# Patient Record
Sex: Female | Born: 1980 | Race: Black or African American | Hispanic: No | Marital: Single | Smoking: Current every day smoker
Health system: Southern US, Community
[De-identification: ages and names within clinical notes are randomized; demographics above are authoritative.]

## PROBLEM LIST (undated history)

## (undated) DIAGNOSIS — J45909 Unspecified asthma, uncomplicated: Secondary | ICD-10-CM

## (undated) HISTORY — DX: Unspecified asthma, uncomplicated: J45.909

---

## 2012-02-26 ENCOUNTER — Ambulatory Visit (INDEPENDENT_AMBULATORY_CARE_PROVIDER_SITE_OTHER): Payer: BC Managed Care – PPO | Admitting: Family Medicine

## 2012-02-26 VITALS — BP 127/86 | HR 90 | Temp 98.7°F | Resp 16 | Ht 64.0 in | Wt 309.0 lb

## 2012-02-26 DIAGNOSIS — M542 Cervicalgia: Secondary | ICD-10-CM

## 2012-02-26 MED ORDER — METHYLPREDNISOLONE 4 MG PO TABS: ORAL_TABLET | ORAL | Status: DC

## 2012-02-26 MED ORDER — CYCLOBENZAPRINE HCL 10 MG PO TABS: ORAL_TABLET | ORAL | Status: DC

## 2012-02-26 NOTE — Progress Notes (Signed)
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 The pain is sharp whenever she rotates her neck left or right. Has no symptoms radiating down her arms, no numbness no, no weakness in arms. She's never had this before. She's had no neck trauma or head trauma.  Objective: Obese woman in no acute distress. She moves her neck slowly and incompletely left and right. Nevertheless her neck is supple.  Has no adenopathy in her neck, neuro exam is negative from both upper extremities with negative reflex changes, sensory changes, motor changes.  She is tender at the base of C7 on the left.  Chest clear  Assessment: Acute court-ordered Collis  Plan Flexeril at night Medrol for 3 days

## 2012-02-26 NOTE — Patient Instructions (Signed)
Torticollis, Acute You have suddenly (acutely) developed a twisted neck (torticollis). This is usually a self-limited condition. CAUSES  Acute torticollis may be caused by malposition, trauma or infection. Most commonly, acute torticollis is caused by sleeping in an awkward position. Torticollis may also be caused by the flexion, extension or twisting of the neck muscles beyond their normal position. Sometimes, the exact cause may not be known. SYMPTOMS  Usually, there is pain and limited movement of the neck. Your neck may twist to one side. DIAGNOSIS  The diagnosis is often made by physical examination. X-rays, CT scans or MRIs may be done if there is a history of trauma or concern of infection. TREATMENT  For a common, stiff neck that develops during sleep, treatment is focused on relaxing the contracted neck muscle. Medications (including shots) may be used to treat the problem. Most cases resolve in several days. Torticollis usually responds to conservative physical therapy. If left untreated, the shortened and spastic neck muscle can cause deformities in the face and neck. Rarely, surgery is required. HOME CARE INSTRUCTIONS   Use over-the-counter and prescription medications as directed by your caregiver.   Do stretching exercises and massage the neck as directed by your caregiver.   Follow up with physical therapy if needed and as directed by your caregiver.  SEEK IMMEDIATE MEDICAL CARE IF:   You develop difficulty breathing or noisy breathing (stridor).   You drool, develop trouble swallowing or have pain with swallowing.   You develop numbness or weakness in the hands or feet.   You have changes in speech or vision.   You have problems with urination or bowel movements.   You have difficulty walking.   You have a fever.   You have increased pain.  MAKE SURE YOU:   Understand these instructions.   Will watch your condition.   Will get help right away if you are not  doing well or get worse.  Document Released: 11/15/2000 Document Revised: 11/07/2011 Document Reviewed: 12/27/2009 [facility] Patient Information 2012 [place], [place].Place neck pain patient instructions here.

## 2013-06-06 ENCOUNTER — Ambulatory Visit (INDEPENDENT_AMBULATORY_CARE_PROVIDER_SITE_OTHER): Payer: Managed Care, Other (non HMO) | Admitting: Family Medicine

## 2013-06-06 ENCOUNTER — Ambulatory Visit: Payer: Managed Care, Other (non HMO)

## 2013-06-06 VITALS — BP 122/74 | HR 101 | Temp 98.1°F | Resp 18 | Ht 64.5 in | Wt 302.0 lb

## 2013-06-06 DIAGNOSIS — M25569 Pain in unspecified knee: Secondary | ICD-10-CM

## 2013-06-06 DIAGNOSIS — G8929 Other chronic pain: Secondary | ICD-10-CM

## 2013-06-06 DIAGNOSIS — M238X9 Other internal derangements of unspecified knee: Secondary | ICD-10-CM

## 2013-06-06 DIAGNOSIS — M25362 Other instability, left knee: Secondary | ICD-10-CM

## 2013-06-06 DIAGNOSIS — M542 Cervicalgia: Secondary | ICD-10-CM

## 2013-06-06 MED ORDER — CYCLOBENZAPRINE HCL 10 MG PO TABS: ORAL_TABLET | ORAL | Status: DC

## 2013-06-06 MED ORDER — NAPROXEN 500 MG PO TABS: 500.0000 mg | ORAL_TABLET | Freq: Two times a day (BID) | ORAL | Status: AC

## 2013-06-06 NOTE — Patient Instructions (Signed)
Patella Problems (Patellofemoral Syndrome) This syndrome is caused by changes in the undersurface of the kneecap (patella). The changes vary from minor inflammation to major changes such as breakdown of the cartilage on the undersurface of the patella. The major changes can be seen with an arthroscope (a small, pencil-sized telescope). These changes can result from various factors. These factors may arise from abnormal tracking (movement or malalignment) of the patella. Normally the Patella is in its normal groove located between the condyles (grooved end) of the femur (thigh bone). Abnormal movement leads to increased pressure in the patellofemoral joint. This leads to swelling in the cartilage, inflammation and pain. SYMPTOMS  The patient with this syndrome usually has an ache in the knee. It is often aggravated by:  Prolonged sitting.  Squatting.  Climbing stairs.  Running down hill.  Other exercising that stresses the knee. Other findings may include the knee giving way, swelling, and or locking. TREATMENT  The treatment will depend on the cause of the problem. Sometimes the solution is as simple as cutting down on activities. Giving your joint a rest with the use of crutches and braces can also help. This is generally followed by strengthening exercises. RECOVERY Recovery from a patellar problem depends on the type of problem in your knee and on the treatment required. If conservative treatment works the recovery period may be as little as three to four weeks. If more aggressive therapy such as surgery is required, the recovery period may be several months. Your caregiver will discuss this with you. HOME CARE INSTRUCTIONS  Following exercise, use an ice pack for twenty to thirty minutes three to four times per day. Use a towel between your ice pack and the skin.  Reduction of inflammation with anti-inflammatories may be helpful. Only take over-the-counter or prescription medicines for  pain, discomfort, or fever as directed by your caregiver.  Taping the knee or using a neoprene sleeve with a patellar cutout to provide better tracking of the patella may give relief.  Muscle (quadriceps) strengthening exercises are helpful. Follow your caregiver's advice.  Muscle stretching prior to exercise may be helpful.  Soft tissue therapy using ultrasound, and diathermy may be helpful.  If conservative therapy is not effective, surgery may provide relief. During arthroscopy, your caregiver may discover a rough surface beneath your kneecap. If this happens, your caregiver may smooth this out by shaving the surface. SEEK MEDICAL CARE IF: If you have surgery, see your caregiver if:  There is increased bleeding or clear fluid (more than a small spot) from the wound.  You notice redness, swelling, or increasing pain in the wound.  Pus is coming from wound.  You develop an unexplained oral temperature above 102 F (38.9 C) develops, or as your caregiver suggests.  You notice a foul smell coming from the wound or dressing.  You develop increasing pain or stiffness in your knee. SEEK IMMEDIATE MEDICAL CARE IF:   You develop a rash.  You have difficulty breathing.  You have any allergic problems. MAKE SURE YOU:   Understand these instructions.  Will watch your condition.  Will get help right away if you are not doing well or get worse. Document Released: 11/15/2000 Document Revised: 02/10/2012 Document Reviewed: 12/05/2008 [facility] Patient Information 2014 [place], [place]. Patellar Dislocation and Subluxation with Phase I Rehab Injuries to the knee often include kneecap (patellar) dislocation or subluxation. The patella is a V-shaped bone that sits in a groove in the thigh bone (trochlea). A patellar dislocation occurs  when the kneecap is displaced from the trochlea, and the joint surfaces are no longer touching. A subluxation is a similar injury, where the kneecap becomes  displaced, but the joint surfaces are still touching. Patellar dislocations and subluxations are most common in adolescents and younger adults. SYMPTOMS   Severe pain in the front of the knee when attempting to move the knee.  A feeling of the knee giving way.  Tenderness, swelling, and bruising (contusion) of the knee.  Numbness or paralysis below the dislocation, from pinching, cutting, or pressure on the blood vessels or nerves (uncommon).  Visible deformity, especially if the dislocation of the kneecap occurs towards the outside of the knee.  Lump on the inner knee, which is the end of the inner part of the thigh bone (femur). CAUSES  Patellar dislocations are caused by a force placed on the kneecap, that is strong enough to displace the bone from its proper alignment. Common causes of injury include:  Direct hit (trauma) to the knee.  Twisting or pivoting injury to the lower limb, when the foot is planted on the ground.  Powerful muscle contraction.  Birth defect (congenital abnormality), such as shallow or malformed joint surfaces. RISK INCREASES WITH:  Contact sports (football, rugby, soccer), sports that require jumping and landing (basketball, volleyball), or sports in which cleats are worn on shoes.  People with wide pelvis, knocked knees, shallow or malformed joint surfaces.  Previous knee injury.  Poor strength and flexibility. PREVENTION  Warm up and stretch properly before activity.  Maintain physical fitness:  Strength, flexibility, and endurance.  Cardiovascular fitness.  For jumping or contact sports, protect the kneecap with supportive devices (elastic bandages, tape, braces, knee sleeves with a hole for the patella and a built-up outer side, or straps to pull the patella inward, or knee pads).  Use cleats of proper length. PROGNOSIS  If treated properly, patellar dislocations and subluxations usually require at least 6 weeks to heal.  RELATED  COMPLICATIONS   Associated fracture or joint cartilage injury.  Damage to nearby nerves or major blood vessels (rare).  Longer healing time or recurring dislocation, if activity is resumed too soon.  Excessive bleeding within the knee, due to dislocation.  Kneecap pain and giving way, often due to inadequate or incomplete rehabilitation.  Unstable or arthritic joint, following repeated injury or delayed treatment. TREATMENT Patellar dislocations and subluxations require immediate realigning of the bones (reduction). Realigning is often completed by hand. However, surgery is sometimes needed. After realignment, treatment first involves the use of ice and medicine, to reduce pain and inflammation. Elevating the injured knee above the level of the heart will also help reduce inflammation. Restraining the knee is often needed, to allow for healing, for up to 6 weeks. After restraint, it is important to perform strengthening and stretching exercises to help regain strength and a full range of motion. These exercises may be completed at home or with a therapist. MEDICATION   If pain medicine is needed, nonsteroidal anti-inflammatory medicines (aspirin and ibuprofen), or other minor pain relievers (acetaminophen), are often advised.  Do not take pain medicine for 7 days before surgery.  Prescription pain relievers may be given, if your caregiver thinks they are needed. Use only as directed and only as much as you need. HEAT AND COLD  Cold treatment (icing) should be applied for 10 to 15 minutes every 2 to 3 hours for inflammation and pain, and immediately after activity that aggravates your symptoms. Use ice packs or  an ice massage.  Heat treatment may be used before performing stretching and strengthening activities prescribed by your caregiver, physical therapist, or athletic trainer. Use a heat pack or a warm water soak. SEEK MEDICAL CARE IF:  Pain, tenderness, or swelling gets worse,  despite treatment.  You experience pain, numbness, or coldness in the foot.  Blue, gray, or dark color appears in the toenails.  Any of the following signs of infection occur after surgery: fever, increased pain, swelling, redness, drainage of fluids, or bleeding in the affected area.  New, unexplained symptoms develop. (Drugs used in treatment may produce side effects.) EXERCISES PHASE I EXERCISES RANGE OF MOTION (ROM) AND STRETCHING EXERCISES - Patellar Dislocation and Subluxation Phase I  FIRST TIME DISLOCATIONS OR SUBLUXATIONS: If you have dislocated or subluxed your kneecap for the first time, your caregiver may ask you to wear a knee brace for up to 6 weeks after your injury. This brace will keep your knee completely straight so that the healing tissues are not disrupted by your knee movement. Your caregiver may allow some motion at your knee by using a hinged brace or by allowing you to take your brace off for a limited amount of time to gently bend and straighten your knee. If this is the case, closely follow your caregiver's directions, in order to allow the best recovery.   CHRONIC OR REPEATED DISLOCATIONS OR SUBLUXATIONS: If you have chronic or repeated dislocations or subluxations, your caregiver will likely not ask you to use a brace. He or she will likely have you begin gentle range of motion activities, within the range that is comfortable for you.  Once you are allowed to start moving your knee, these are some of the initial exercises that may be included in your rehabilitation program. Continue to perform them until you see your caregiver again or until your symptoms go away. While completing these exercises, remember:   Restoring tissue flexibility helps normal motion to return to the joints. This allows healthier, less painful movement and activity.  An effective stretch should be held for at least 30 seconds.  A stretch should never be painful. You should only feel a  gentle lengthening or release in the stretched tissue. RANGE OF MOTION - Knee Flexion, Active  Lie on your back with both knees straight. (If this causes back discomfort, bend your opposite knee, placing your foot flat on the floor.)  Slowly slide your heel back toward your buttocks until you feel a gentle stretch in the front of your knee or thigh.  Hold for __________ seconds. Slowly slide your heel back to the starting position. Repeat __________ times. Complete this exercise __________ times per day.  RANGE OF MOTION - Knee Flexion and Extension, Active-Assisted  Sit on the edge of a table or chair with your thighs firmly supported. It may be helpful to place a folded towel under the end of your right / left thigh.  Flexion (bending): Place the ankle of your healthy leg on top of the other ankle. Use your healthy leg to gently bend your right / left knee until you feel a mild tension across the top of your knee.  Hold for __________ seconds.  Extension (straightening): Switch your ankles so your right / left leg is on top. Use your healthy leg to straighten your right / left knee until you feel a mild tension on the backside of your knee.  Hold for __________ seconds. Repeat __________ times. Complete this exercise __________ times per  day. STRETCH - Knee Flexion, Supine  Lie on the floor with your right / left heel and foot lightly touching the wall. (Place both feet on the wall if you do not use a door frame.)  Without using any effort, allow gravity to slide your foot down the wall slowly until you feel a gentle stretch in the front of your right / left knee.  Hold this stretch for __________ seconds. Then return the leg to the starting position, using your healthy leg for help, if needed. Repeat __________ times. Complete this stretch __________ times per day.  STRENGTHENING EXERCISES - Patellar Dislocation and Subluxation Phase I These exercises may help you when beginning to  rehabilitate your injury. They may resolve your symptoms with or without further involvement from your physician, physical therapist or athletic trainer. While completing these exercises, remember:   Muscles can gain both the endurance and the strength needed for everyday activities through controlled exercises.  Complete these exercises as instructed by your physician, physical therapist or athletic trainer. Increase the resistance and repetitions only as guided by your caregiver. STRENGTH - Quadriceps, Isometrics  Lie on your back with your right / left leg extended and your opposite knee bent.  Gradually tense the muscles in the front of your right / left thigh. You should see either your kneecap slide up toward your hip or increased dimpling just above the knee. This motion will push the back of the knee down toward the floor, mat, or bed on which you are lying.  Hold the muscle as tight as you can, without increasing your pain, for __________ seconds.  Relax the muscles slowly and completely in between each repetition. Repeat __________ times. Complete this exercise __________ times per day.  STRENGTH - Quadriceps, Straight Leg Raises Quality counts! Watch for signs that the quadriceps muscle is working, to be sure you are strengthening the correct muscles and not "cheating" by substituting with healthier muscles.  Lay on your back with your right / left leg extended and your opposite knee bent.  Tense the muscles in the front of your right / left thigh. You should see either your kneecap slide up or increased dimpling just above the knee. Your thigh may even shake a bit.  Tighten these muscles even more and raise your leg 4 to 6 inches off the floor. Hold for __________ seconds.  Keeping these muscles tense, lower your leg.  Relax the muscles slowly and completely between each repetition. Repeat __________ times. Complete this exercise __________ times per day.  STRENGTH - Hip  Abductors, Straight Leg Raises Be aware of your form throughout the entire exercise, so that you exercise the correct muscles. Poor form means that you are not strengthening the correct muscles.  Lie on your side so that your head, shoulders, knee and hip line up. You may bend your lower knee to help maintain your balance. Your right / left leg should be on top.  Roll your hips slightly forward, so that your hips are stacked directly over each other and your right / left knee is facing forward.  Lift your top leg up 4-6 inches, leading with your heel. Be sure that your foot does not drift forward or that your knee does not roll toward the ceiling.  Hold this position for __________ seconds. You should feel the muscles in your outer hip lifting. (You may not notice this until your leg begins to tire.)  Slowly lower your leg to the starting position. Allow  the muscles to fully relax before beginning the next repetition. Repeat __________ times. Complete this exercise __________ times per day.  STRENGTH - Hip Extensors, Straight Leg Raises  Lie on your stomach on a firm surface.  Tense the muscles in your buttocks to lift your right / left leg about 4 inches. If you cannot lift your leg this high without arching your back, place a pillow under your hips.  Keep your knee straight. Hold __________ seconds.  Slowly lower your leg to the starting position and allow it to relax completely before completing the next repetition. Repeat __________ times. Complete this exercise __________ times per day.  STRENGTH - Hip Adductors, Straight Leg Raises  Lie on your side so that your head, shoulders, knee and hip line up. You may place your upper foot in front, to help maintain your balance. Your right / left leg should be on the bottom.  Roll your hips slightly forward, so that your hips are stacked directly over each other and your right / left knee is facing forward.  Tense the muscles in your inner  thigh and lift your bottom leg 4-6 inches. Hold this position for __________ seconds.  Slowly lower your leg to the starting position. Allow the muscles to fully relax before beginning the next repetition. Repeat __________ times. Complete this exercise __________ times per day.  Document Released: 11/18/2005 Document Revised: 02/10/2012 Document Reviewed: 03/02/2009 [facility] Patient Information 2014 [place], [place].

## 2013-06-06 NOTE — Progress Notes (Signed)
Subjective:    Patient ID: Katie Torres, female    DOB: 08-Jan-1981, 32 y.o.   MRN: [id number] Chief Complaint  Patient presents with  . Knee Pain    pops in and out of place since age 51 was playing volley friday when it happen again    HPI  She frequently feels her left knee cap click and it is very painful - just lasts a second - has never seen it actually pop out of place but she assumes this is what is happening. When this first started happening over 15 yrs, she initially had an orthopedist eval with xrays - told her nothing was wrong so has just been putting up with it since.  However, 2d ago when playing volleyball it happened twice and since has had continuing pain in the lower medial aspect of her knee which is unusual for her.  Has tried some biofreeze cream and hot baths with some improvement but still hurts to walk and can't flex her knee all the way.  Lots of women in her family have had to have knee surgeries.  Past Medical History  Diagnosis Date  . Asthma    Current Outpatient Prescriptions on File Prior to Visit  Medication Sig Dispense Refill  . cyclobenzaprine (FLEXERIL) 10 MG tablet Take one each night before bedtime  7 tablet  0   No current facility-administered medications on file prior to visit.   No Known Allergies  Family History  Problem Relation Age of Onset  . Hypertension Mother   . Diabetes Father    History reviewed. No pertinent past surgical history.   Review of Systems  Constitutional: Positive for activity change. Negative for fever, chills and unexpected weight change.  Musculoskeletal: Positive for joint swelling, arthralgias and gait problem. Negative for myalgias and back pain.  Skin: Negative for color change and rash.  Neurological: Negative for weakness and numbness.       BP 122/74  Pulse 101  Temp(Src) 98.1 F (36.7 C) (Oral)  Resp 18  Ht 5' 4.5" (1.638 m)  Wt 302 lb (136.986 kg)  BMI 51.06 kg/m2  SpO2 98%  LMP  05/21/2013 Objective:   Physical Exam  Constitutional: She is oriented to person, place, and time. She appears well-developed and well-nourished. No distress.  HENT:  Head: Normocephalic and atraumatic.  Right Ear: External ear normal.  Eyes: Conjunctivae are normal. No scleral icterus.  Pulmonary/Chest: Effort normal.  Musculoskeletal:       Right knee: Normal.       Left knee: She exhibits decreased range of motion, abnormal patellar mobility, abnormal meniscus and MCL laxity. She exhibits no swelling, no effusion, no ecchymosis, normal alignment, no LCL laxity and no bony tenderness. Tenderness found. Medial joint line and MCL tenderness noted. No lateral joint line, no LCL and no patellar tendon tenderness noted.  Normal extension and passive flexion but active flexion mildly reduced.  Neurological: She is alert and oriented to person, place, and time.  Skin: Skin is warm and dry. She is not diaphoretic. No erythema.  Psychiatric: She has a normal mood and affect. Her behavior is normal.     [facility] reading (PRIMARY) by  Dr. Clelia Croft. No acute abnormality  Assessment & Plan:  Patellar instability, left - Plan: Ambulatory referral to Orthopedic Surgery - rec ice tid, naprosyn, and neoprene sleave with patellar cut out. When back to baseline, start quad strengthening exercises.  Will refer to ortho since diagnosis unclear but suspect pt will actually  need PT.  Knee pain, chronic, left - Plan: DG Knee 1-2 Views Left, Ambulatory referral to Orthopedic Surgery   Meds ordered this encounter  Medications  . cyclobenzaprine (FLEXERIL) 10 MG tablet    Sig: Take one each night before bedtime    Dispense:  7 tablet    Refill:  0  . naproxen (NAPROSYN) 500 MG tablet    Sig: Take 1 tablet (500 mg total) by mouth 2 (two) times daily with a meal.    Dispense:  60 tablet    Refill:  0

## 2013-12-28 IMAGING — CR DG KNEE 1-2V*L*
2 series · 2 of 2 positions shown · non-contrast
Comparison: None

CLINICAL DATA: Knee pain

LEFT KNEE - 1-2 VIEW

[AP]
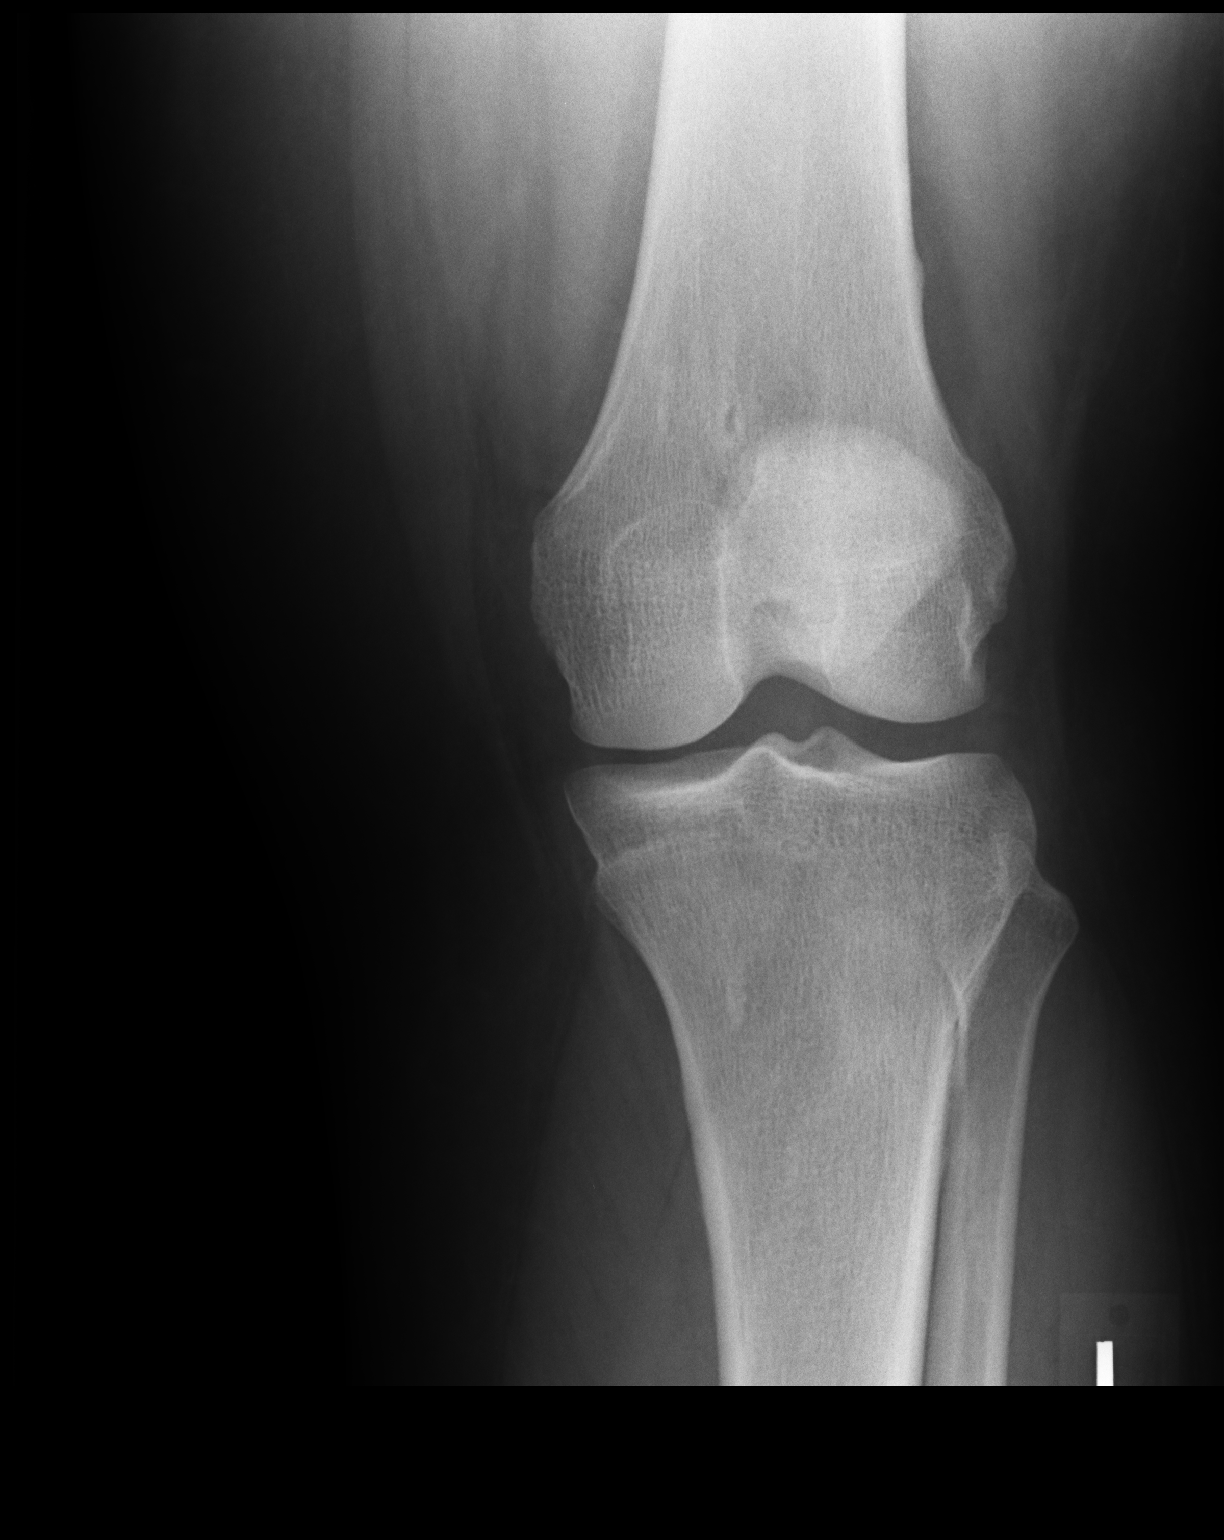

[lateral]
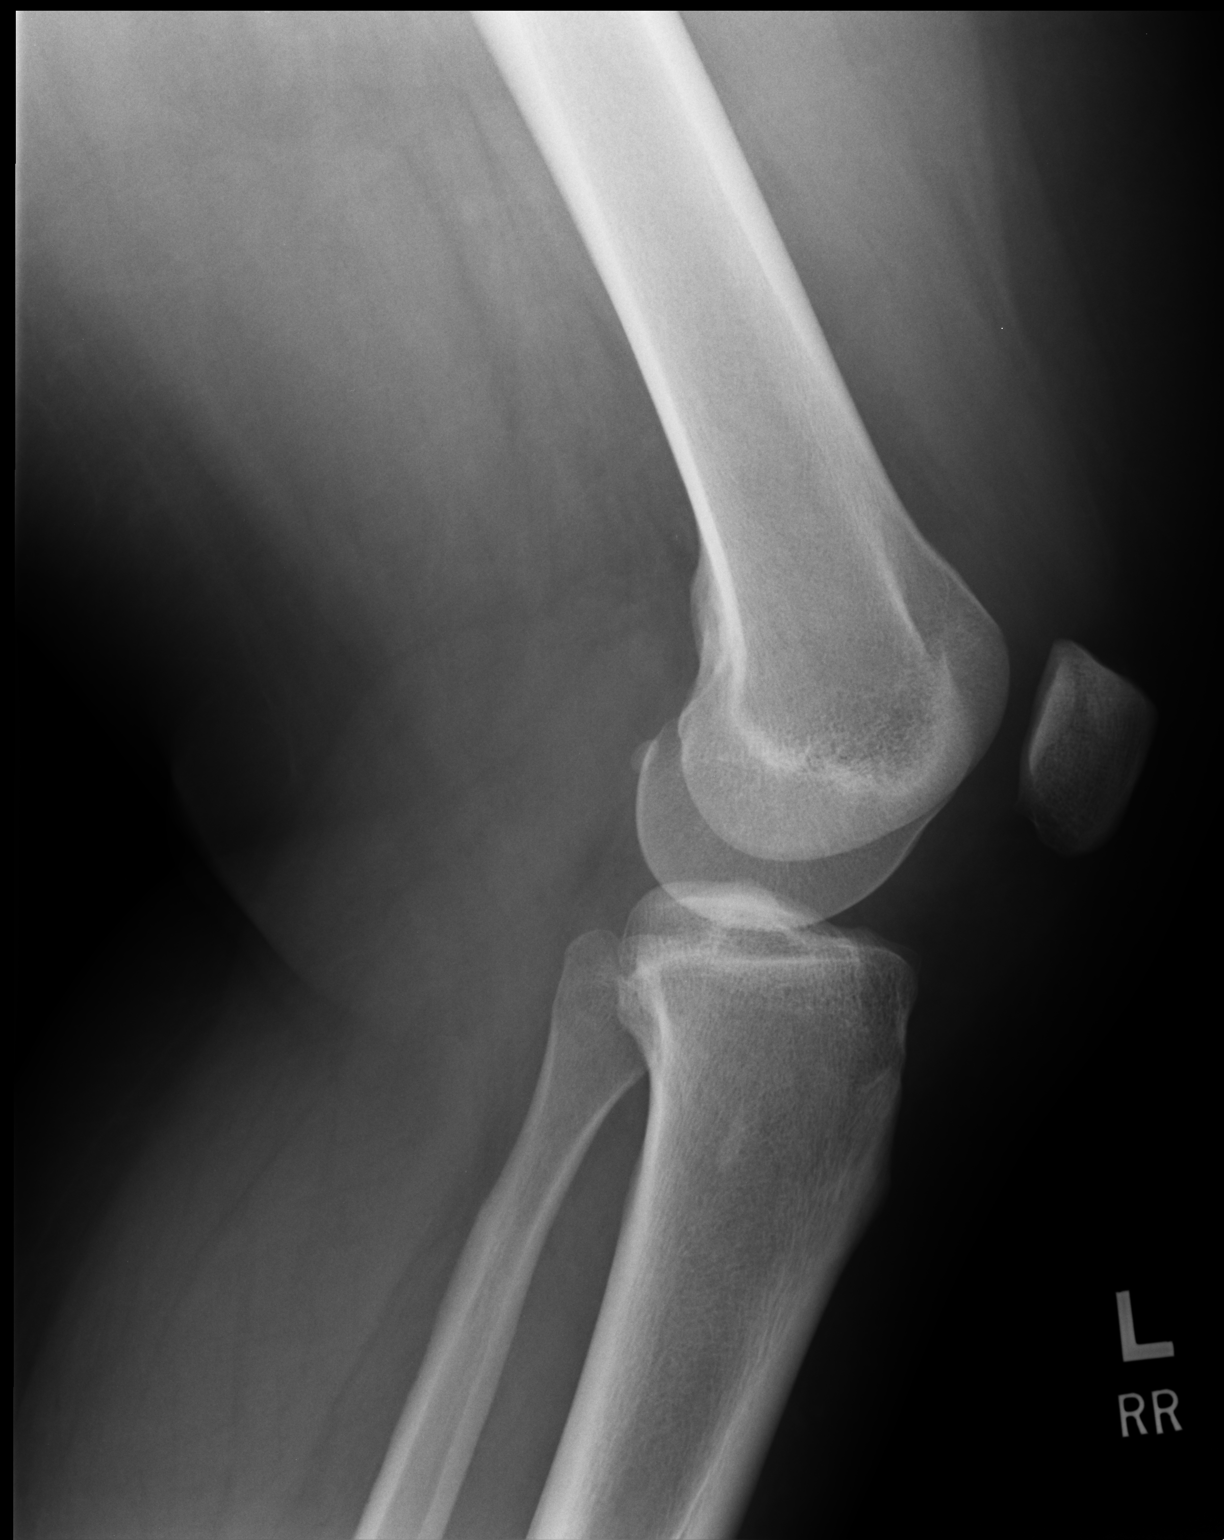

[2 of 2 positions shown; findings below may reference images not displayed]

FINDINGS: There is no evidence of fracture or dislocation.  There
is no evidence of arthropathy or other focal bone abnormality.
Soft tissues are unremarkable.
IMPRESSION: Negative examination.

Clinically significant discrepancy from primary report, if
provided: None

## 2015-01-10 ENCOUNTER — Ambulatory Visit (INDEPENDENT_AMBULATORY_CARE_PROVIDER_SITE_OTHER): Payer: Managed Care, Other (non HMO) | Admitting: Emergency Medicine

## 2015-01-10 VITALS — BP 113/77 | HR 110 | Temp 97.8°F | Resp 18 | Ht 64.0 in | Wt 304.0 lb

## 2015-01-10 DIAGNOSIS — S46911A Strain of unspecified muscle, fascia and tendon at shoulder and upper arm level, right arm, initial encounter: Secondary | ICD-10-CM

## 2015-01-10 MED ORDER — ACETAMINOPHEN-CODEINE #3 300-30 MG PO TABS: 1.0000 | ORAL_TABLET | ORAL | Status: AC | PRN

## 2015-01-10 MED ORDER — CYCLOBENZAPRINE HCL 10 MG PO TABS: 10.0000 mg | ORAL_TABLET | Freq: Three times a day (TID) | ORAL | Status: DC | PRN

## 2015-01-10 MED ORDER — NAPROXEN SODIUM 550 MG PO TABS: 550.0000 mg | ORAL_TABLET | Freq: Two times a day (BID) | ORAL | Status: AC

## 2015-01-10 NOTE — Progress Notes (Signed)
[facility] [address], [place] [address] [phone]  Date:  01/10/2015   Name:  Katie Torres   DOB:  1981-03-28   MRN:  [id number]  PCP:  No primary care provider on file.    Chief Complaint: Shoulder Pain and Chest Pain   History of Present Illness:  Katie Torres is a 34 y.o. very pleasant female patient who presents with the following:  Injured her right shoulder Saturday night bowling.  Now has pain in shoulder into neck.  No paresthesias or weakness in arm No direct injury Works on computers in billing. No improvement with over the counter medications or other home remedies.  Denies other complaint or health concern today.   There are no active problems to display for this patient.   Past Medical History  Diagnosis Date  . Asthma     History reviewed. No pertinent past surgical history.  History  Substance Use Topics  . Smoking status: Current Every Day Smoker  . Smokeless tobacco: Not on file  . Alcohol Use: No    Family History  Problem Relation Age of Onset  . Hypertension Mother   . Diabetes Father     No Known Allergies  Medication list has been reviewed and updated.  Current Outpatient Prescriptions on File Prior to Visit  Medication Sig Dispense Refill  . cyclobenzaprine (FLEXERIL) 10 MG tablet Take one each night before bedtime (Patient not taking: Reported on 01/10/2015) 7 tablet 0  . naproxen (NAPROSYN) 500 MG tablet Take 1 tablet (500 mg total) by mouth 2 (two) times daily with a meal. (Patient not taking: Reported on 01/10/2015) 60 tablet 0   No current facility-administered medications on file prior to visit.    Review of Systems:  As per HPI, otherwise negative.    Physical Examination: Filed Vitals:   01/10/15 1326  BP: 113/77  Pulse: 110  Temp: 97.8 F (36.6 C)  Resp: 18   Filed Vitals:   01/10/15 1326  Height: 5\' 4"  (1.626 m)  Weight: 304 lb (137.893 kg)   Body mass index is 52.16  kg/(m^2). Ideal Body Weight: Weight in (lb) to have BMI = 25: 145.3   GEN: WDWN, NAD, Non-toxic, Alert & Oriented x 3 HEENT: Atraumatic, Normocephalic.  Ears and Nose: No external deformity. EXTR: No clubbing/cyanosis/edema NEURO: Normal gait.  PSYCH: Normally interactive. Conversant. Not depressed or anxious appearing.  Calm demeanor.  Right shoulder:  Tender anterior shoulder-pectoral region.  Full ROM shoulder.  Assessment and Plan: Shoulder strain Anaprox Flexeril tyl #3 RICE  Signed,  Phillips OdorJeffery Mady Oubre, MD

## 2015-01-10 NOTE — Patient Instructions (Signed)
Shoulder Sprain °A shoulder sprain is the result of damage to the tough, fiber-like tissues (ligaments) that help hold your shoulder in place. The ligaments may be stretched or torn. Besides the main shoulder joint (the ball and socket), there are several smaller joints that connect the bones in this area. A sprain usually involves one of those joints. Most often it is the acromioclavicular (or AC) joint. That is the joint that connects the collarbone (clavicle) and the shoulder blade (scapula) at the top point of the shoulder blade (acromion). °A shoulder sprain is a mild form of what is called a shoulder separation. Recovering from a shoulder sprain may take some time. For some, pain lingers for several months. Most people recover without long term problems. °CAUSES  °· A shoulder sprain is usually caused by some kind of trauma. This might be: °¨ Falling on an outstretched arm. °¨ Being hit hard on the shoulder. °¨ Twisting the arm. °· Shoulder sprains are more likely to occur in people who: °¨ Play sports. °¨ Have balance or coordination problems. °SYMPTOMS  °· Pain when you move your shoulder. °· Limited ability to move the shoulder. °· Swelling and tenderness on top of the shoulder. °· Redness or warmth in the shoulder. °· Bruising. °· A change in the shape of the shoulder. °DIAGNOSIS  °Your healthcare provider may: °· Ask about your symptoms. °· Ask about recent activity that might have caused those symptoms. °· Examine your shoulder. You may be asked to do simple exercises to test movement. The other shoulder will be examined for comparison. °· Order some tests that provide a look inside the body. They can show the extent of the injury. The tests could include: °¨ X-rays. °¨ CT (computed tomography) scan. °¨ MRI (magnetic resonance imaging) scan. °RISKS AND COMPLICATIONS °· Loss of full shoulder motion. °· Ongoing shoulder pain. °TREATMENT  °How long it takes to recover from a shoulder sprain depends on how  severe it was. Treatment options may include: °· Rest. You should not use the arm or shoulder until it heals. °· Ice. For 2 or 3 days after the injury, put an ice pack on the shoulder up to 4 times a day. It should stay on for 15 to 20 minutes each time. Wrap the ice in a towel so it does not touch your skin. °· Over-the-counter medicine to relieve pain. °· A sling or brace. This will keep the arm still while the shoulder is healing. °· Physical therapy or rehabilitation exercises. These will help you regain strength and motion. Ask your healthcare provider when it is OK to begin these exercises. °· Surgery. The need for surgery is rare with a sprained shoulder, but some people may need surgery to keep the joint in place and reduce pain. °HOME CARE INSTRUCTIONS  °· Ask your healthcare provider about what you should and should not do while your shoulder heals. °· Make sure you know how to apply ice to the correct area of your shoulder. °· Talk with your healthcare provider about which medications should be used for pain and swelling. °· If rehabilitation therapy will be needed, ask your healthcare provider to refer you to a therapist. If it is not recommended, then ask about at-home exercises. Find out when exercise should begin. °SEEK MEDICAL CARE IF:  °Your pain, swelling, or redness at the joint increases. °SEEK IMMEDIATE MEDICAL CARE IF:  °· You have a fever. °· You cannot move your arm or shoulder. °Document Released: 04/06/2009 Document   Revised: 02/10/2012 Document Reviewed: 04/06/2009 °ExitCare® Patient Information ©2015 [employer]. This information is not intended to replace advice given to you by your health care provider. Make sure you discuss any questions you have with your health care provider. ° °

## 2015-04-23 ENCOUNTER — Ambulatory Visit (INDEPENDENT_AMBULATORY_CARE_PROVIDER_SITE_OTHER): Payer: Managed Care, Other (non HMO) | Admitting: Emergency Medicine

## 2015-04-23 VITALS — BP 110/80 | HR 121 | Temp 98.9°F | Resp 16 | Ht 64.0 in | Wt 299.4 lb

## 2015-04-23 DIAGNOSIS — H66009 Acute suppurative otitis media without spontaneous rupture of ear drum, unspecified ear: Secondary | ICD-10-CM

## 2015-04-23 DIAGNOSIS — J020 Streptococcal pharyngitis: Secondary | ICD-10-CM | POA: Diagnosis not present

## 2015-04-23 MED ORDER — AMOXICILLIN 875 MG PO TABS: 875.0000 mg | ORAL_TABLET | Freq: Two times a day (BID) | ORAL | Status: AC

## 2015-04-23 NOTE — Patient Instructions (Signed)
Strep Strep Throat Strep throat is an infection of the throat caused by a bacteria named Streptococcus pyogenes. Your health care provider may call the infection streptococcal "tonsillitis" or "pharyngitis" depending on whether there are signs of inflammation in the tonsils or back of the throat. Strep throat is most common in children aged 32-15 years during the cold months of the year, but it can occur in people of any age during any season. This infection is spread from person to person (contagious) through coughing, sneezing, or other close contact. SIGNS AND SYMPTOMS   Fever or chills.  Painful, swollen, red tonsils or throat.  Pain or difficulty when swallowing.  White or yellow spots on the tonsils or throat.  Swollen, tender lymph nodes or "glands" of the neck or under the jaw.  Red rash all over the body (rare). DIAGNOSIS  Many different infections can cause the same symptoms. A test must be done to confirm the diagnosis so the right treatment can be given. A "rapid strep test" can help your health care provider make the diagnosis in a few minutes. If this test is not available, a light swab of the infected area can be used for a throat culture test. If a throat culture test is done, results are usually available in a day or two. TREATMENT  Strep throat is treated with antibiotic medicine. HOME CARE INSTRUCTIONS   Gargle with 1 tsp of salt in 1 cup of warm water, 3-4 times per day or as needed for comfort.  Family members who also have a sore throat or fever should be tested for strep throat and treated with antibiotics if they have the strep infection.  Make sure everyone in your household washes their hands well.  Do not share food, drinking cups, or personal items that could cause the infection to spread to others.  You may need to eat a soft food diet until your sore throat gets better.  Drink enough water and fluids to keep your urine clear or pale yellow. This will help  prevent dehydration.  Get plenty of rest.  Stay home from school, day care, or work until you have been on antibiotics for 24 hours.  Take medicines only as directed by your health care provider.  Take your antibiotic medicine as directed by your health care provider. Finish it even if you start to feel better. SEEK MEDICAL CARE IF:   The glands in your neck continue to enlarge.  You develop a rash, cough, or earache.  You cough up green, yellow-brown, or bloody sputum.  You have pain or discomfort not controlled by medicines.  Your problems seem to be getting worse rather than better.  You have a fever. SEEK IMMEDIATE MEDICAL CARE IF:   You develop any new symptoms such as vomiting, severe headache, stiff or painful neck, chest pain, shortness of breath, or trouble swallowing.  You develop severe throat pain, drooling, or changes in your voice.  You develop swelling of the neck, or the skin on the neck becomes red and tender.  You develop signs of dehydration, such as fatigue, dry mouth, and decreased urination.  You become increasingly sleepy, or you cannot wake up completely. MAKE SURE YOU:  Understand these instructions.  Will watch your condition.  Will get help right away if you are not doing well or get worse. Document Released: 11/15/2000 Document Revised: 04/04/2014 Document Reviewed: 01/17/2011 [facility] Patient Information 2015 [employer] [place]. This information is not intended to replace advice given to you  by your health care provider. Make sure you discuss any questions you have with your health care provider.  

## 2015-04-23 NOTE — Progress Notes (Signed)
Subjective:  Patient ID: Katie Torres, female    DOB: [birth year]  Age: 34 y.o. MRN: [id number]  CC: Sore Throat; Ear Pain; Headache; Abdominal Pain; and Chills   HPI Katie Torres presents for sudden acute illness. She became ill Friday with sore throat, fever, and chills. She has no cough, wheezing, or shortness breath. She has no nausea or vomiting. No stool change. She has pain in both of her ears. She describes her sore throat is rather severe and is not able to swallow food without discomfort. She is drinking liquids easily.  She's had no improvement with over-the-counter medication she denies any acute medical problems..  Outpatient Prescriptions Prior to Visit  Medication Sig Dispense Refill  . acetaminophen-codeine (TYLENOL #3) 300-30 MG per tablet Take 1-2 tablets by mouth every 4 (four) hours as needed. (Patient not taking: Reported on 04/23/2015) 30 tablet 0  . cyclobenzaprine (FLEXERIL) 10 MG tablet Take one each night before bedtime (Patient not taking: Reported on 01/10/2015) 7 tablet 0  . cyclobenzaprine (FLEXERIL) 10 MG tablet Take 1 tablet (10 mg total) by mouth 3 (three) times daily as needed for muscle spasms. (Patient not taking: Reported on 04/23/2015) 30 tablet 0  . naproxen (NAPROSYN) 500 MG tablet Take 1 tablet (500 mg total) by mouth 2 (two) times daily with a meal. (Patient not taking: Reported on 01/10/2015) 60 tablet 0  . naproxen sodium (ANAPROX DS) 550 MG tablet Take 1 tablet (550 mg total) by mouth 2 (two) times daily with a meal. (Patient not taking: Reported on 04/23/2015) 40 tablet 0  . phentermine 37.5 MG capsule Take 37.5 mg by mouth every morning.     No facility-administered medications prior to visit.    ROS Review of Systems  Constitutional: Positive for fever, chills and fatigue. Negative for appetite change.  HENT: Positive for ear pain and sore throat. Negative for congestion, postnasal drip and sinus pressure.   Eyes: Negative for pain and redness.    Respiratory: Negative for cough, shortness of breath and wheezing.   Cardiovascular: Negative for leg swelling.  Gastrointestinal: Negative for nausea, vomiting, abdominal pain, diarrhea, constipation and blood in stool.  Endocrine: Negative for polyuria.  Genitourinary: Negative for dysuria, urgency, frequency and flank pain.  Musculoskeletal: Negative for gait problem.  Skin: Negative for rash.  Neurological: Negative for weakness and headaches.  Psychiatric/Behavioral: Negative for confusion and decreased concentration. The patient is not nervous/anxious.     Objective:  BP 110/80 mmHg  Pulse 121  Temp(Src) 98.9 F (37.2 C) (Oral)  Resp 16  Ht  (1.626 m)  Wt 299 lb 6.4 oz (135.807 kg)  BMI 51.37 kg/m2  SpO2 98%  LMP 03/06/2015  BP Readings from Last 3 Encounters:  04/23/15 110/80  01/10/15 113/77  06/06/13 122/74    Wt Readings from Last 3 Encounters:  04/23/15 299 lb 6.4 oz (135.807 kg)  01/10/15 304 lb (137.893 kg)  06/06/13 302 lb (136.986 kg)    Physical Exam  Constitutional: She is oriented to person, place, and time. She appears well-developed and well-nourished. No distress.  HENT:  Head: Normocephalic and atraumatic.  Right Ear: External ear normal.  Left Ear: Hearing and external ear normal. Tympanic membrane is injected and erythematous. A middle ear effusion is present.  Nose: Nose normal.  Mouth/Throat: Oropharyngeal exudate present.  Eyes: Conjunctivae and EOM are normal. Pupils are equal, round, and reactive to light. No scleral icterus.  Neck: Normal range of motion. Neck supple. No tracheal deviation  present.  Cardiovascular: Normal rate, regular rhythm and normal heart sounds.   Pulmonary/Chest: Effort normal. No respiratory distress. She has no wheezes. She has no rales.  Abdominal: She exhibits no mass. There is no tenderness. There is no rebound and no guarding.  Musculoskeletal: She exhibits no edema.  Lymphadenopathy:    She has no  cervical adenopathy.  Neurological: She is alert and oriented to person, place, and time. Coordination normal.  Skin: Skin is warm and dry. No rash noted.  Psychiatric: She has a normal mood and affect. Her behavior is normal.    No results found for: WBC, HGB, HCT, PLT, GLUCOSE, CHOL, TRIG, HDL, LDLDIRECT, LDLCALC, ALT, AST, NA, K, CL, CREATININE, BUN, CO2, TSH, PSA, INR, GLUF, HGBA1C, MICROALBUR    Assessment & Plan:   Katie Torres was seen today for sore throat, ear pain, headache, abdominal pain and chills.  Diagnoses and all orders for this visit:  Streptococcal sore throat  Morbid obesity  Acute suppurative otitis media without spontaneous rupture of ear drum, recurrence not specified, unspecified laterality  Other orders -     amoxicillin (AMOXIL) 875 MG tablet; Take 1 tablet (875 mg total) by mouth 2 (two) times daily.   I am having Katie Torres start on amoxicillin. I am also having her maintain her cyclobenzaprine, naproxen, phentermine, naproxen sodium, cyclobenzaprine, and acetaminophen-codeine.  Meds ordered this encounter  Medications  . amoxicillin (AMOXIL) 875 MG tablet    Sig: Take 1 tablet (875 mg total) by mouth 2 (two) times daily.    Dispense:  20 tablet    Refill:  0     Follow-up: Return if symptoms worsen or fail to improve.  Carmelina DaneAnderson, Siana Panameno S, MD

## 2015-04-23 NOTE — Addendum Note (Signed)
Addended by: Carmelina DaneANDERSON, Kartier Bennison S on: 04/23/2015 09:34 AM   Modules accepted: Kipp BroodSmartSet

## 2017-05-02 ENCOUNTER — Ambulatory Visit (HOSPITAL_COMMUNITY)
Admission: EM | Admit: 2017-05-02 | Discharge: 2017-05-02 | Disposition: A | Discharge status: Home / Self Care | Payer: Managed Care, Other (non HMO) | Attending: Internal Medicine | Admitting: Internal Medicine

## 2017-05-02 ENCOUNTER — Encounter (HOSPITAL_COMMUNITY): Payer: Self-pay

## 2017-05-02 DIAGNOSIS — T148XXA Other injury of unspecified body region, initial encounter: Secondary | ICD-10-CM | POA: Diagnosis not present

## 2017-05-02 LAB — POCT URINALYSIS DIP (DEVICE)
Bilirubin Urine: NEGATIVE
GLUCOSE, UA: NEGATIVE mg/dL
Ketones, ur: NEGATIVE mg/dL
LEUKOCYTES UA: NEGATIVE
NITRITE: NEGATIVE
Protein, ur: 30 mg/dL — AB
UROBILINOGEN UA: 0.2 mg/dL (ref 0.0–1.0)
pH: 6 (ref 5.0–8.0)

## 2017-05-02 MED ORDER — CYCLOBENZAPRINE HCL 10 MG PO TABS: 10.0000 mg | ORAL_TABLET | Freq: Two times a day (BID) | ORAL | 0 refills | Status: AC | PRN

## 2017-05-02 NOTE — ED Provider Notes (Signed)
[facility]    CSN: [id number] Arrival date & time: 05/02/17  4098     History   Chief Complaint Chief Complaint  Patient presents with  . Back Pain    HPI Katie Torres is a 36 y.o. female.   Pt with no PMH in usual state of health reports that her back tightened up on her yesterday. Denies radiating pain. Denies loss of B/B control.       Past Medical History:  Diagnosis Date  . Asthma     Patient Active Problem List   Diagnosis Date Noted  . Morbid obesity (HCC) 04/23/2015    History reviewed. No pertinent surgical history.  OB History    No data available       Home Medications    Prior to Admission medications   Medication Sig Start Date End Date Taking? Authorizing Provider  acetaminophen-codeine (TYLENOL #3) 300-30 MG per tablet Take 1-2 tablets by mouth every 4 (four) hours as needed. Patient not taking: Reported on 04/23/2015 01/10/15   Carmelina Dane, MD  amoxicillin (AMOXIL) 875 MG tablet Take 1 tablet (875 mg total) by mouth 2 (two) times daily. 04/23/15   Carmelina Dane, MD  cyclobenzaprine (FLEXERIL) 10 MG tablet Take 1 tablet (10 mg total) by mouth 3 times/day as needed-between meals & bedtime for muscle spasms. 05/02/17   Arnaldo Natal, MD  naproxen (NAPROSYN) 500 MG tablet Take 1 tablet (500 mg total) by mouth 2 (two) times daily with a meal. Patient not taking: Reported on 01/10/2015 06/06/13   Sherren Mocha, MD  phentermine 37.5 MG capsule Take 37.5 mg by mouth every morning.    [provider]    Family History Family History  Problem Relation Age of Onset  . Hypertension Mother   . Diabetes Father     Social History Social History  Substance Use Topics  . Smoking status: Current Every Day Smoker  . Smokeless tobacco: Never Used  . Alcohol use No     Allergies   Patient has no known allergies.   Review of Systems Review of Systems  Constitutional: Negative for chills and fever.  HENT: Negative for  sore throat and tinnitus.   Eyes: Negative for redness.  Respiratory: Negative for cough and shortness of breath.   Cardiovascular: Negative for chest pain and palpitations.  Gastrointestinal: Negative for abdominal pain, diarrhea, nausea and vomiting.  Genitourinary: Negative for dysuria, frequency and urgency.  Musculoskeletal: Positive for back pain. Negative for myalgias.  Skin: Negative for rash.       No lesions  Neurological: Negative for weakness.  Hematological: Does not bruise/bleed easily.  Psychiatric/Behavioral: Negative for suicidal ideas.     Physical Exam Triage Vital Signs ED Triage Vitals  Enc Vitals Group     BP 05/02/17 1905 114/70     Pulse Rate 05/02/17 1905 (!) 110     Resp 05/02/17 1905 20     Temp 05/02/17 1905 98.4 F (36.9 C)     Temp Source 05/02/17 1905 Oral     SpO2 05/02/17 1905 98 %     Weight --      Height --      Head Circumference --      Peak Flow --      Pain Score 05/02/17 1907 10     Pain Loc --      Pain Edu? --      Excl. in GC? --    No data  found.   Updated Vital Signs BP 114/70 (BP Location: Right Arm)   Pulse (!) 110   Temp 98.4 F (36.9 C) (Oral)   Resp 20   LMP 04/18/2017 (Within Days)   SpO2 98%   Visual Acuity Right Eye Distance:   Left Eye Distance:   Bilateral Distance:    Right Eye Near:   Left Eye Near:    Bilateral Near:     Physical Exam  Constitutional: She appears well-developed and well-nourished. No distress.  HENT:  Head: Normocephalic and atraumatic.  Eyes: Conjunctivae are normal.  Neck: Neck supple.  Cardiovascular: Normal rate and regular rhythm.   No murmur heard. Pulmonary/Chest: Effort normal and breath sounds normal. No respiratory distress.  Abdominal: Soft.  Musculoskeletal: She exhibits no edema.  Left lower paraspinal tenderness; no point tenderness of spinous processes  Neurological: She is alert.  Skin: Skin is warm and dry.  Psychiatric: She has a normal mood and affect.    Nursing note and vitals reviewed.    UC Treatments / Results  Labs (all labs ordered are listed, but only abnormal results are displayed) Labs Reviewed  POCT URINALYSIS DIP (DEVICE) - Abnormal; Notable for the following:       Result Value   Hgb urine dipstick SMALL (*)    Protein, ur 30 (*)    All other components within normal limits    EKG  EKG Interpretation None       Radiology No results found.  Procedures Procedures (including critical care time)  Medications Ordered in UC Medications - No data to display   Initial Impression / Assessment and Plan / UC Course  I have reviewed the triage vital signs and the nursing notes.  Pertinent labs & imaging results that were available during my care of the patient were reviewed by me and considered in my medical decision making (see chart for details).     No red flag symptoms. Musculoskeletal pain. Encouraged light activity. Rehab exercises demonstrated. NSAID therapy with food. Muscle relaxants prn.   Final Clinical Impressions(s) / UC Diagnoses   Final diagnoses:  Muscle strain    New Prescriptions New Prescriptions   CYCLOBENZAPRINE (FLEXERIL) 10 MG TABLET    Take 1 tablet (10 mg total) by mouth 3 times/day as needed-between meals & bedtime for muscle spasms.     Arnaldo Nataliamond, Aaliayah Miao S, MD 05/02/17 2006

## 2017-05-02 NOTE — ED Triage Notes (Signed)
Pt having pain in her low back on her left side for 2 days, no injury reported. York SpanielSaid it hurts a little bit in her left leg but mostly her back. Did take ibuprofen earlier without relief.
